# Patient Record
Sex: Male | Born: 1970 | Race: White | Hispanic: No | Marital: Single | Smoking: Never smoker
Health system: Southern US, Community
[De-identification: ages and names within clinical notes are randomized; demographics above are authoritative.]

## PROBLEM LIST (undated history)

## (undated) HISTORY — PX: HAND SURGERY: SHX662

## (undated) HISTORY — PX: EYE SURGERY: SHX253

## (undated) HISTORY — PX: APPENDECTOMY: SHX54

---

## 2015-04-17 ENCOUNTER — Emergency Department (HOSPITAL_COMMUNITY): Payer: 59

## 2015-04-17 ENCOUNTER — Encounter (HOSPITAL_COMMUNITY): Payer: Self-pay | Admitting: Emergency Medicine

## 2015-04-17 ENCOUNTER — Emergency Department (HOSPITAL_COMMUNITY)
Admission: EM | Admit: 2015-04-17 | Discharge: 2015-04-17 | Disposition: A | Discharge status: Home / Self Care | Payer: 59 | Attending: Emergency Medicine | Admitting: Emergency Medicine

## 2015-04-17 DIAGNOSIS — Y9289 Other specified places as the place of occurrence of the external cause: Secondary | ICD-10-CM | POA: Insufficient documentation

## 2015-04-17 DIAGNOSIS — X58XXXA Exposure to other specified factors, initial encounter: Secondary | ICD-10-CM | POA: Insufficient documentation

## 2015-04-17 DIAGNOSIS — Y998 Other external cause status: Secondary | ICD-10-CM | POA: Insufficient documentation

## 2015-04-17 DIAGNOSIS — Y9302 Activity, running: Secondary | ICD-10-CM | POA: Diagnosis not present

## 2015-04-17 DIAGNOSIS — S82832A Other fracture of upper and lower end of left fibula, initial encounter for closed fracture: Secondary | ICD-10-CM | POA: Diagnosis not present

## 2015-04-17 DIAGNOSIS — S99912A Unspecified injury of left ankle, initial encounter: Secondary | ICD-10-CM | POA: Diagnosis present

## 2015-04-17 MED ORDER — IBUPROFEN 600 MG PO TABS: 600.0000 mg | ORAL_TABLET | Freq: Four times a day (QID) | ORAL | Status: AC | PRN

## 2015-04-17 NOTE — Discharge Instructions (Signed)
Fibular Ankle Fracture, Adult A fibular fracture at your ankle is a break (fracture) bone in the smallest of the two bones in your lower leg, located on the outside of your leg (fibula) close to the area at your ankle joint. CAUSES  Rolling your ankle.  Twisting your ankle.  Extreme flexing or extending of your foot.  Severe force on your ankle as when falling from a distance. RISK FACTORS  Jumping activities.  Participation in sports.  Osteoporosis.  Advanced age.  Previous ankle injuries. SIGNS AND SYMPTOMS  Pain.  Swelling.  Inability to put weight on injured ankle.  Bruising.  Bone deformities at site of injury. DIAGNOSIS  This fracture is diagnosed with the help of an X-ray exam. TREATMENT  If the fractured bone did not move out of place it usually will heal without problems and does casting or splinting. If immobilization is needed for comfort or the fractured bone moved out of place and will not heal properly with immobilization, a cast or splint will be used. HOME CARE INSTRUCTIONS   Apply ice to the area of injury:  Put ice in a plastic bag.  Place a towel between your skin and the bag.  Leave the ice on for 20 minutes, 2-3 times a day.  Use crutches as directed. Resume walking without crutches as directed by your health care provider.  Only take over-the-counter or prescription medicines for pain, discomfort, or fever as directed by your health care provider.  If you have a removable splint or boot, do not remove the boot unless directed by your health care provider. SEEK MEDICAL CARE IF:   You have continued pain or more swelling  The medications do not control the pain. SEEK IMMEDIATE MEDICAL CARE IF:  You develop severe pain in the leg or foot.  Your skin or nails below the injury turn blue or grey or feel cold or numb. MAKE SURE YOU:   Understand these instructions.  Will watch your condition.  Will get help right away if you are not  doing well or get worse.   This information is not intended to replace advice given to you by your health care provider. Make sure you discuss any questions you have with your health care provider.   Document Released: 02/10/2005 Document Revised: 03/03/2014 Document Reviewed: 09/22/2012 Elsevier Interactive Patient Education 2016 [employer] RICE for Routine Care of Injuries Theroutine careofmanyinjuriesincludes rest, ice, compression, and elevation (RICE therapy). RICE therapy is often recommended for injuries to soft tissues, such as a muscle strain, ligament injuries, bruises, and overuse injuries. It can also be used for some bony injuries. Using RICE therapy can help to relieve pain, lessen swelling, and enable your body to heal. Rest Rest is required to allow your body to heal. This usually involves reducing your normal activities and avoiding use of the injured part of your body. Generally, you can return to your normal activities when you are comfortable and have been given permission by your health care provider. Ice Icing your injury helps to keep the swelling down, and it lessens pain. Do not apply ice directly to your skin.  Put ice in a plastic bag.  Place a towel between your skin and the bag.  Leave the ice on for 20 minutes, 2-3 times a day. Do this for as long as you are directed by your health care provider. Compression Compression means putting pressure on the injured area. Compression helps to keep swelling down, gives support, and helps with  discomfort. Compression may be done with an elastic bandage. If an elastic bandage has been applied, follow these general tips:  Remove and reapply the bandage every 3-4 hours or as directed by your health care provider.  Make sure the bandage is not wrapped too tightly, because this can cut off circulation. If part of your body beyond the bandage becomes blue, numb, cold, swollen, or more painful, your bandage is most likely  too tight. If this occurs, remove your bandage and reapply it more loosely.  See your health care provider if the bandage seems to be making your problems worse rather than better. Elevation Elevation means keeping the injured area raised. This helps to lessen swelling and decrease pain. If possible, your injured area should be elevated at or above the level of your heart or the center of your chest. WHEN SHOULD I SEEK MEDICAL CARE? You should seek medical care if:  Your pain and swelling continue.  Your symptoms are getting worse rather than improving. These symptoms may indicate that further evaluation or further X-rays are needed. Sometimes, X-rays may not show a small broken bone (fracture) until a number of days later. Make a follow-up appointment with your health care provider. WHEN SHOULD I SEEK IMMEDIATE MEDICAL CARE? You should seek immediate medical care if:  You have sudden severe pain at or below the area of your injury.  You have redness or increased swelling around your injury.  You have tingling or numbness at or below the area of your injury that does not improve after you remove the elastic bandage.   This information is not intended to replace advice given to you by your health care provider. Make sure you discuss any questions you have with your health care provider.   Document Released: 05/25/2000 Document Revised: 11/01/2014 Document Reviewed: 01/18/2014 Elsevier Interactive Patient Education [employer]

## 2015-04-17 NOTE — ED Notes (Signed)
Patient here with complaint of left ankle injury. States he was running, stepped in a hole, felt/heard a pop, and went to the ground. Left ankle appears swollen in triage. Patient states he applied ice to area directly after injury.

## 2015-04-17 NOTE — Progress Notes (Addendum)
Orthopedic Tech Progress Note Patient Details:  Kurt Walsh 1970/12/11 [id number]  Ortho Devices Type of Ortho Device: Ankle splint Ortho Device/Splint Location: lle Ortho Device/Splint Interventions: Ordered, Application Pt has own crutches.  Trinna Post 04/17/2015, 10:43 PM

## 2015-04-17 NOTE — ED Provider Notes (Signed)
CSN: [id number]     Arrival date & time 04/17/15  2130 History   First MD Initiated Contact with Patient 04/17/15 2218     Chief Complaint  Patient presents with  . Ankle Injury     (Consider location/radiation/quality/duration/timing/severity/associated sxs/prior Treatment) HPI Comments: Patient c/o L ankle pain after rolling his ankle in a hole while running. He reports hearing a "pop". No medications taken PTA. Patient was able to weight bear for 1 mile following his injury, though this aggravated his pain. No hx of prior L ankle injury or fx.  Patient is a 45 y.o. male presenting with lower extremity injury. The history is provided by the patient. No language interpreter was used.  Ankle Injury This is a new problem. The current episode started today. The problem occurs constantly. The problem has been unchanged. Associated symptoms include arthralgias and joint swelling. Pertinent negatives include no fever, numbness or weakness. The symptoms are aggravated by walking. He has tried ice for the symptoms. The treatment provided no relief.    History reviewed. No pertinent past medical history. Past Surgical History  Procedure Laterality Date  . Eye surgery    . Appendectomy    . Hand surgery     History reviewed. No pertinent family history. Social History  Substance Use Topics  . Smoking status: Never Smoker   . Smokeless tobacco: None  . Alcohol Use: Yes     Comment: social    Review of Systems  Constitutional: Negative for fever.  Musculoskeletal: Positive for joint swelling and arthralgias.  Neurological: Negative for weakness and numbness.  All other systems reviewed and are negative.     Allergies  Review of patient's allergies indicates no known allergies.  Home Medications   Prior to Admission medications   Medication Sig Start Date End Date Taking? Authorizing Provider  ibuprofen (ADVIL,MOTRIN) 600 MG tablet Take 1 tablet (600 mg total) by mouth every 6  (six) hours as needed. 04/17/15   Antony Madura, PA-C   BP 152/95 mmHg  Pulse 92  Temp(Src) 98 F (36.7 C) (Oral)  Resp 20  Ht  (1.727 m)  Wt 80.74 kg  BMI 27.07 kg/m2  SpO2 98%   Physical Exam  Constitutional: He is oriented to person, place, and time. He appears well-developed and well-nourished. No distress.  HENT:  Head: Normocephalic and atraumatic.  Eyes: Conjunctivae and EOM are normal. No scleral icterus.  Neck: Normal range of motion.  Cardiovascular: Normal rate, regular rhythm and intact distal pulses.   DP and PT pulses 2+ in the LLE  Pulmonary/Chest: Effort normal. No respiratory distress.  Musculoskeletal: Normal range of motion.       Left ankle: He exhibits swelling. He exhibits normal range of motion (full AROM), no deformity and normal pulse. Tenderness. Lateral malleolus tenderness found. Achilles tendon normal.  Neurological: He is alert and oriented to person, place, and time. He exhibits normal muscle tone. Coordination normal.  Sensation intact in the LLE and foot. Patient able to wiggle all toes.  Skin: Skin is warm and dry. No rash noted. He is not diaphoretic. No erythema. No pallor.  Psychiatric: He has a normal mood and affect. His behavior is normal.  Nursing note and vitals reviewed.   ED Course  Procedures (including critical care time) Labs Review Labs Reviewed - No data to display  Imaging Review Dg Ankle Complete Left  04/17/2015  CLINICAL DATA:  Rolled left ankle, with left lateral ankle pain and swelling. Initial encounter.  EXAM: LEFT ANKLE COMPLETE - 3+ VIEW COMPARISON:  None. FINDINGS: A tiny osseous fragment adjacent to the distal fibula may reflect a small avulsion injury. The ankle mortise is intact; the interosseous space is within normal limits. No talar tilt or subluxation is seen. A plantar calcaneal spur is seen. An ankle joint effusion is noted. Lateral soft tissue swelling is noted. IMPRESSION: 1. Tiny osseous fragment adjacent to  the distal fibula may reflect a small avulsion injury. 2. Ankle joint effusion noted. Electronically Signed   By: Roanna Raider M.D.   On: 04/17/2015 22:04   I have personally reviewed and evaluated these images and lab results as part of my medical decision-making.   EKG Interpretation None      MDM   Final diagnoses:  Closed avulsion fracture of distal fibula, left, initial encounter    Patient with L ankle pain with evidence of small avulsion injury to the distal fibula. Patient neurovascularly intact. No bony deformity or crepitus. Will manage with ASO and WBAT as well as RICE and NSAIDs. Orthopedic referral given and return precautions discussed. Patient agreeable to plan with no unaddressed concerns; discharged in good condition.    Antony Madura, PA-C 04/17/15 2247  Rolland Porter, MD 04/23/15 [phone]

## 2016-08-03 IMAGING — DX DG ANKLE COMPLETE 3+V*L*
3 series · 3 of 3 positions shown · non-contrast
Comparison: None.

CLINICAL DATA: Rolled left ankle, with left lateral ankle pain and
swelling. Initial encounter.

EXAM:
LEFT ANKLE COMPLETE - 3+ VIEW

[ankle ap]
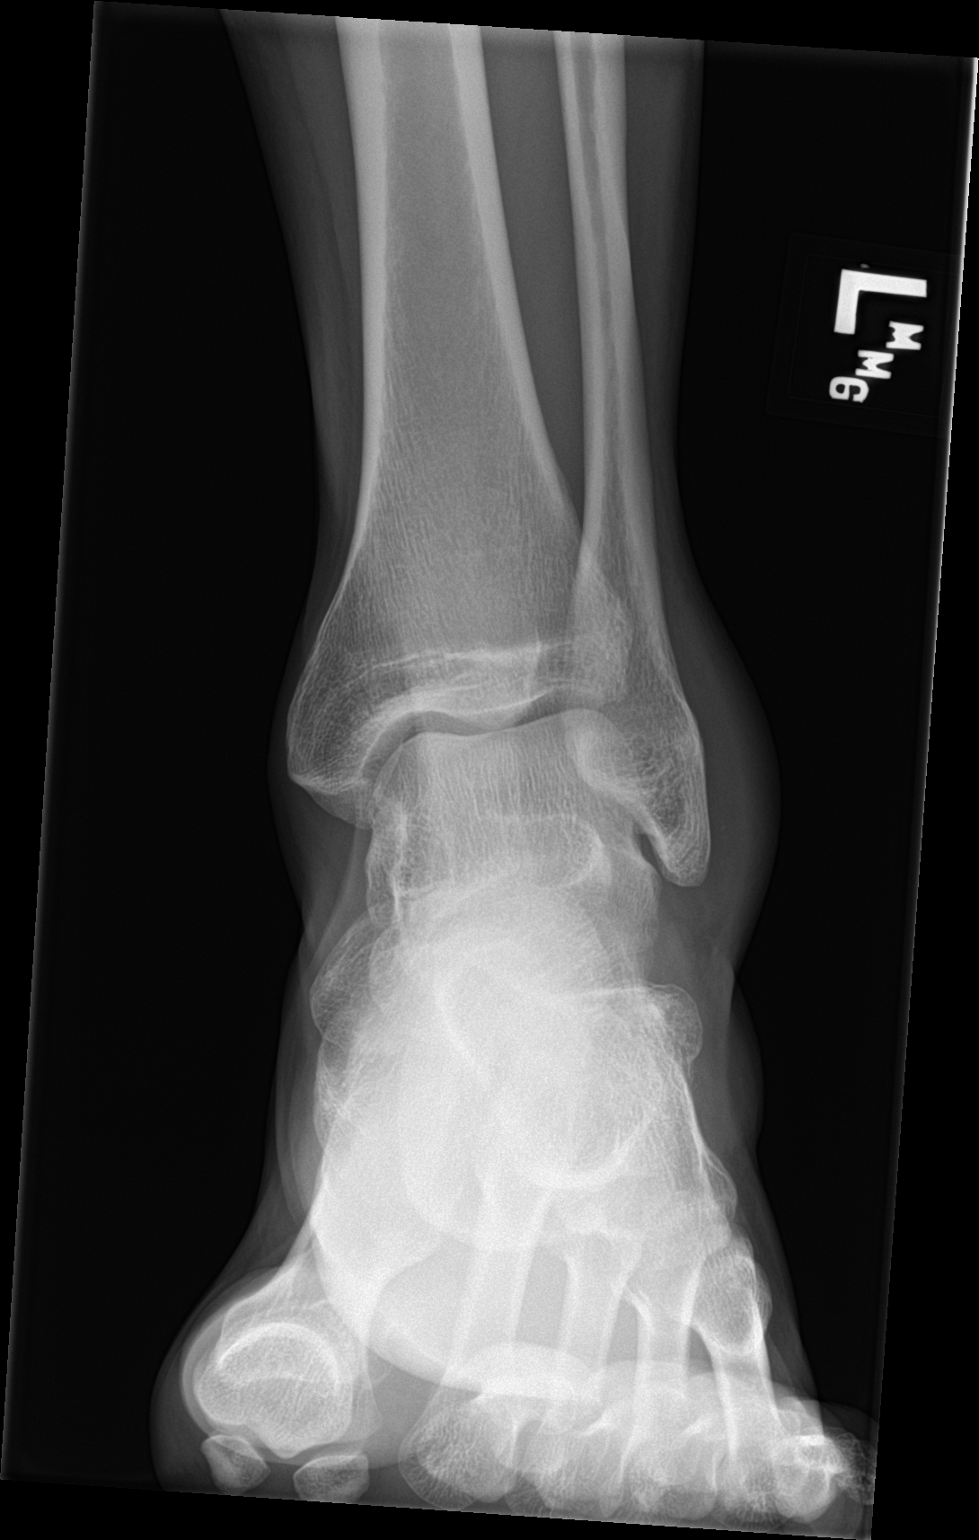

[ankle obl]
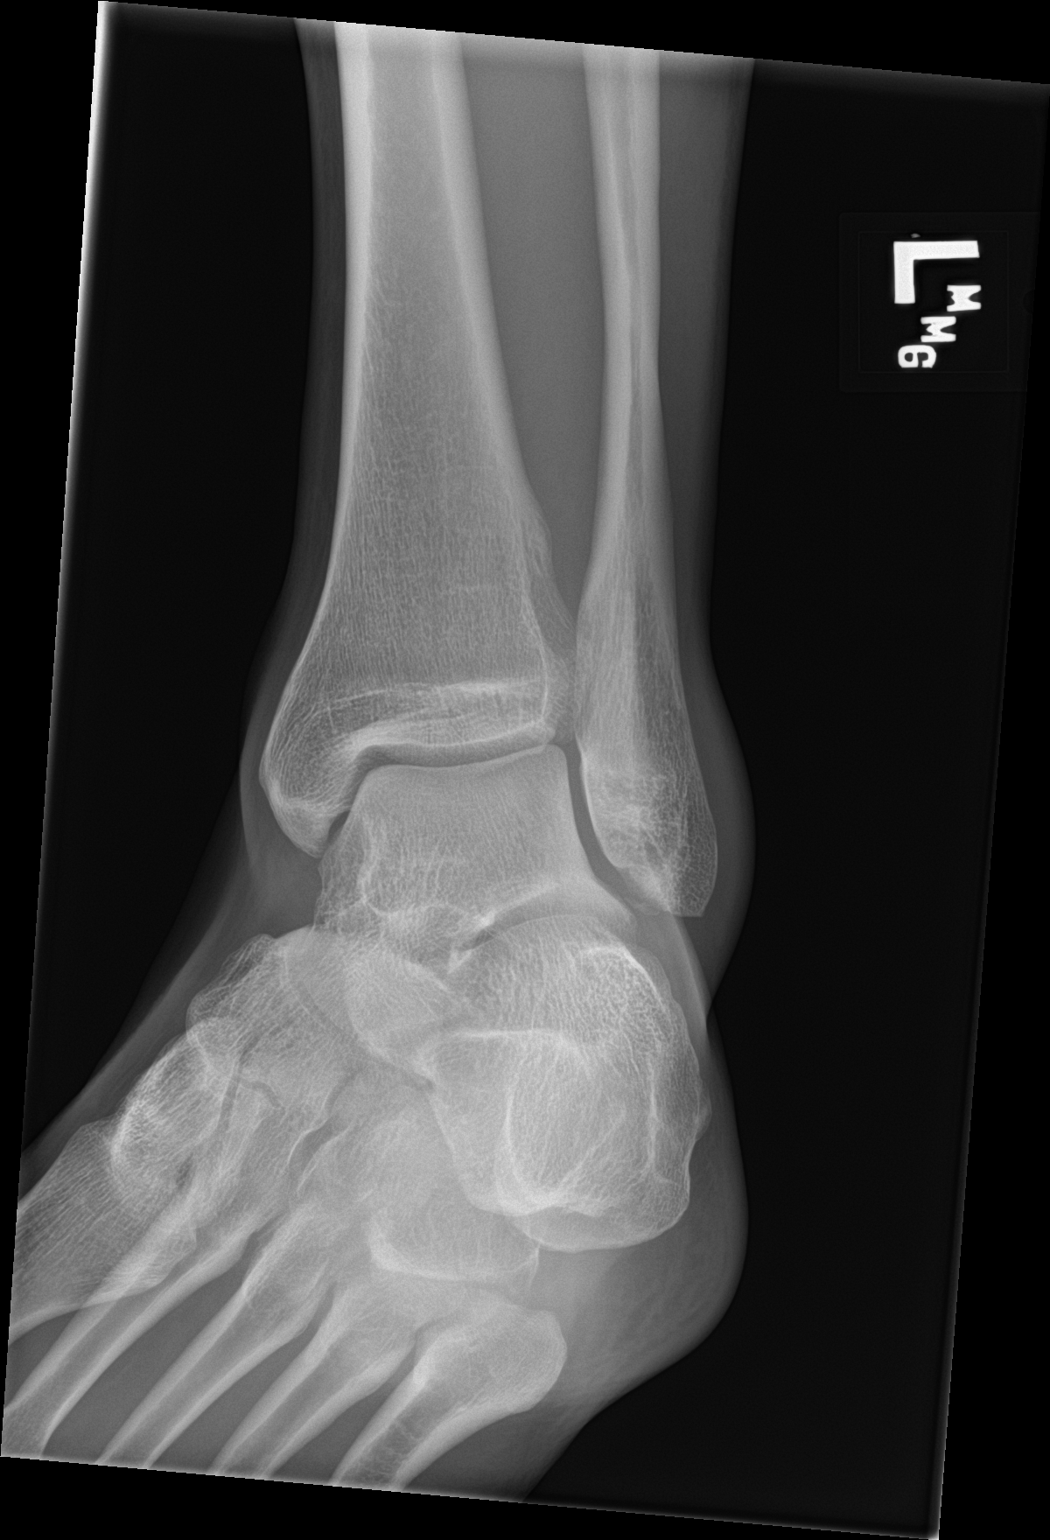

[ankle lat]
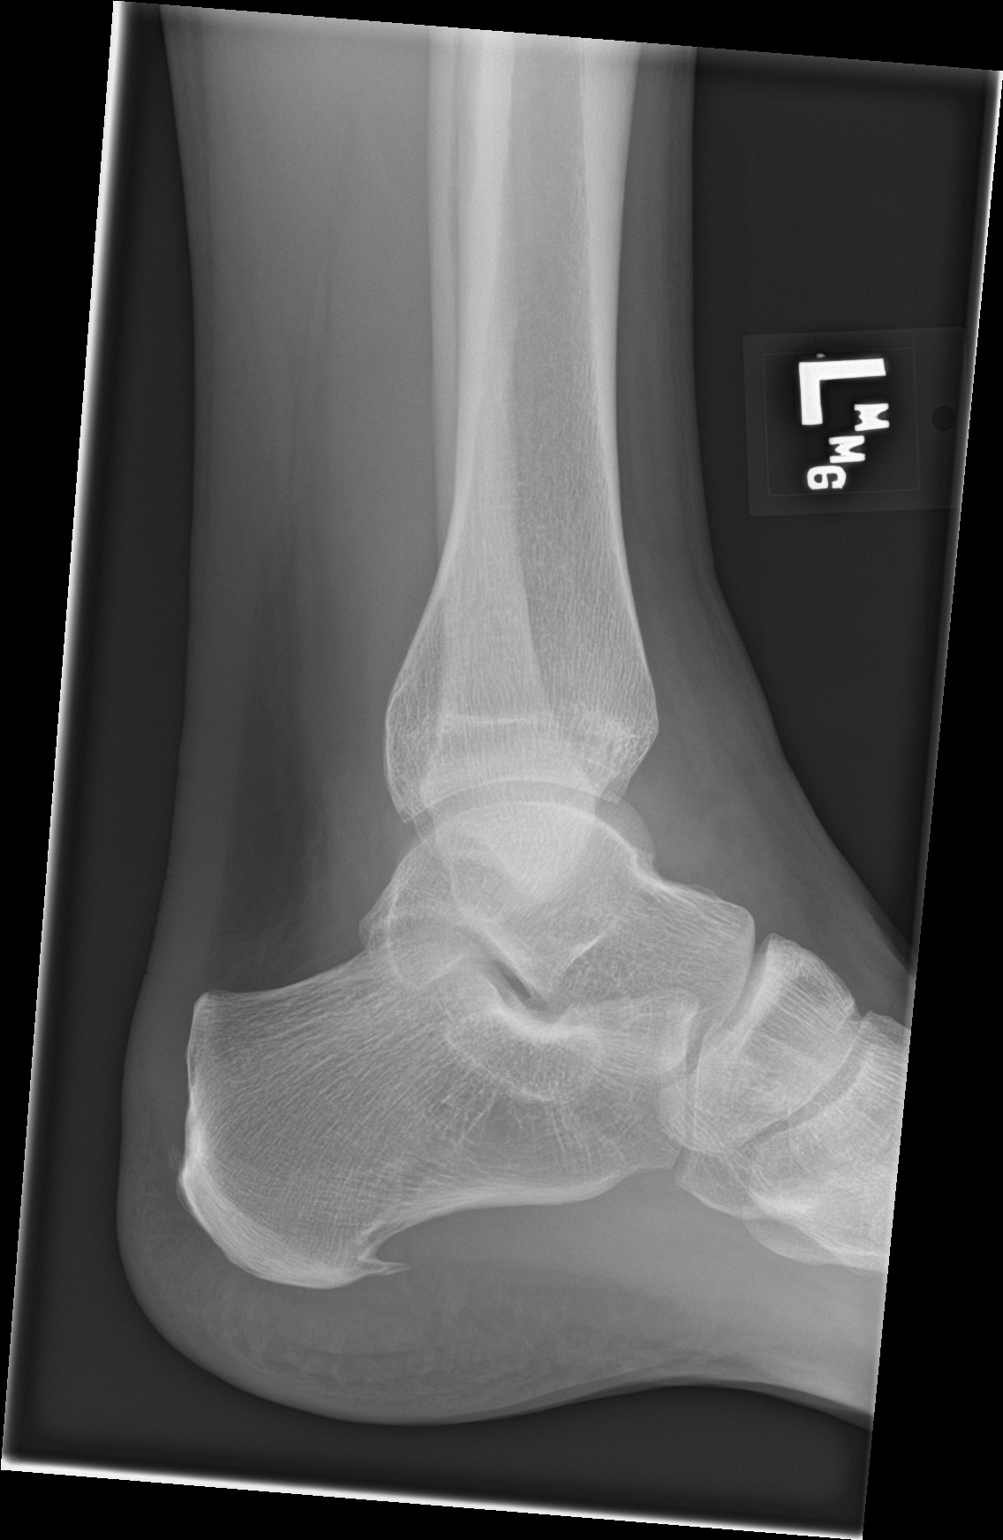

[3 of 3 positions shown; findings below may reference images not displayed]

FINDINGS: A tiny osseous fragment adjacent to the distal fibula may reflect a
small avulsion injury. The ankle mortise is intact; the interosseous
space is within normal limits. No talar tilt or subluxation is seen.
A plantar calcaneal spur is seen.

An ankle joint effusion is noted. Lateral soft tissue swelling is
noted.
IMPRESSION: 1. Tiny osseous fragment adjacent to the distal fibula may reflect a
small avulsion injury.
2. Ankle joint effusion noted.
